# Patient Record
Sex: Male | Born: 1985 | Race: White | Hispanic: No | Marital: Single | Smoking: Never smoker
Health system: Southern US, Community
[De-identification: ages and names within clinical notes are randomized; demographics above are authoritative.]

---

## 2009-07-03 ENCOUNTER — Encounter: Admission: RE | Admit: 2009-07-03 | Discharge: 2009-07-03 | Payer: Self-pay | Admitting: Occupational Medicine

## 2010-05-17 ENCOUNTER — Emergency Department (HOSPITAL_BASED_OUTPATIENT_CLINIC_OR_DEPARTMENT_OTHER)
Admission: EM | Admit: 2010-05-17 | Discharge: 2010-05-17 | Disposition: A | Discharge status: Home / Self Care | Payer: Worker's Compensation | Attending: Emergency Medicine | Admitting: Emergency Medicine

## 2010-05-17 DIAGNOSIS — T1590XA Foreign body on external eye, part unspecified, unspecified eye, initial encounter: Secondary | ICD-10-CM | POA: Insufficient documentation

## 2010-05-17 DIAGNOSIS — Y9289 Other specified places as the place of occurrence of the external cause: Secondary | ICD-10-CM | POA: Insufficient documentation

## 2010-05-17 DIAGNOSIS — Z7721 Contact with and (suspected) exposure to potentially hazardous body fluids: Secondary | ICD-10-CM | POA: Insufficient documentation

## 2011-03-15 ENCOUNTER — Ambulatory Visit: Payer: Self-pay

## 2011-03-15 ENCOUNTER — Other Ambulatory Visit: Payer: Self-pay | Admitting: Occupational Medicine

## 2011-03-15 DIAGNOSIS — R52 Pain, unspecified: Secondary | ICD-10-CM

## 2012-05-26 ENCOUNTER — Ambulatory Visit
Admission: RE | Admit: 2012-05-26 | Discharge: 2012-05-26 | Disposition: A | Discharge status: Home / Self Care | Payer: Worker's Compensation | Source: Ambulatory Visit | Attending: Occupational Medicine | Admitting: Occupational Medicine

## 2012-05-26 ENCOUNTER — Other Ambulatory Visit: Payer: Self-pay | Admitting: Occupational Medicine

## 2012-05-26 DIAGNOSIS — R52 Pain, unspecified: Secondary | ICD-10-CM

## 2013-06-07 ENCOUNTER — Ambulatory Visit: Payer: 59

## 2013-06-25 ENCOUNTER — Emergency Department (HOSPITAL_COMMUNITY)
Admission: EM | Admit: 2013-06-25 | Discharge: 2013-06-25 | Disposition: A | Discharge status: Home / Self Care | Payer: 59 | Source: Home / Self Care

## 2013-06-25 ENCOUNTER — Encounter (HOSPITAL_COMMUNITY): Payer: Self-pay | Admitting: Emergency Medicine

## 2013-06-25 DIAGNOSIS — S01511A Laceration without foreign body of lip, initial encounter: Secondary | ICD-10-CM

## 2013-06-25 DIAGNOSIS — S0181XA Laceration without foreign body of other part of head, initial encounter: Secondary | ICD-10-CM

## 2013-06-25 DIAGNOSIS — S0180XA Unspecified open wound of other part of head, initial encounter: Secondary | ICD-10-CM
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
## 2013-06-25 NOTE — Discharge Instructions (Signed)
Facial Laceration A facial laceration is a cut on the face. These injuries can be painful and cause bleeding. Some cuts may need to be closed with stitches (sutures), skin adhesive strips, or wound glue. Cuts usually heal quickly but can leave a scar. It can take 1 2 years for the scar to go away completely. HOME CARE   Only take medicines as told by your doctor.  Follow your doctor's instructions for wound care. For Stitches:  Keep the cut clean and dry.  If you have a bandage (dressing), change it at least once a day. Change the bandage if it gets wet or dirty, or as told by your doctor.  Wash the cut with soap and water 2 times a day. Rinse the cut with water. Pat it dry with a clean towel.  Put a thin layer of medicated cream on the cut as told by your doctor.  You may shower after the first 24 hours. Do not soak the cut in water until the stitches are removed.  Have your stitches removed as told by your doctor.  Do not wear any makeup until a few days after your stitches are removed. For Skin Adhesive Strips:  Keep the cut clean and dry.  Do not get the strips wet. You may take a bath, but be careful to keep the cut dry.  If the cut gets wet, pat it dry with a clean towel.  The strips will fall off on their own. Do not remove the strips that are still stuck to the cut. For Wound Glue:  You may shower or take baths. Do not soak or scrub the cut. Do not swim. Avoid heavy sweating until the glue falls off on its own. After a shower or bath, pat the cut dry with a clean towel.  Do not put medicine or makeup on your cut until the glue falls off.  If you have a bandage, do not put tape over the glue.  Avoid lots of sunlight or tanning lamps until the glue falls off.  The glue will fall off on its own in 5 10 days. Do not pick at the glue. After Healing: Put sunscreen on the cut for the first year to reduce your scar. GET HELP RIGHT AWAY IF:   Your cut area gets red,  painful, or puffy (swollen).  You see a yellowish-white fluid (pus) coming from the cut.  You have chills or a fever. MAKE SURE YOU:   Understand these instructions.  Will watch your condition.  Will get help right away if you are not doing well or get worse. Document Released: 07/17/2007 Document Revised: 11/18/2012 Document Reviewed: 09/10/2012 [facility] Patient Information 2014 [place], [place].  Open Wound, Lip An open wound is a break in the skin caused by an injury. An open wound to the lip can be a scrape, cut, or hole (puncture). Good wound care will help:   Lessen pain.  Prevent infection.  Lessen scarring. HOME CARE  Wash off all dirt.  Clean your wounds daily with gentle soap and water.  Eat soft foods or liquids while your wound is healing.  Rinse the wound with salt water after each meal.  Apply medicated cream after the wound has been cleaned as told by your doctor.  Follow up with your doctor as told. GET HELP RIGHT AWAY IF:   There is more redness or puffiness (swelling) in or around the wound.  There is increasing pain.  You have a temperature by  mouth above 102 F (38.9 C), not controlled by medicine.  Your baby is older than 3 months with a rectal temperature of 102 F (38.9 C) or higher.  Your baby is 863 months old or younger with a rectal temperature of 100.4 F (38 C) or higher.  There is yellowish white fluid (pus) coming from the wound.  Very bad pain develops that is not controlled with medicine.  There is a red line on the skin above or below the wound. MAKE SURE YOU:   Understand these instructions.  Will watch this condition.  Will get help right away if you are not doing well or get worse. Document Released: 04/26/2008 Document Revised: 04/22/2011 Document Reviewed: 05/16/2009 [facility] Patient Information 2014 [place], [place].  Mouth Injury Cuts and scrapes inside the mouth are common from falls or bites. They tend to bleed  a lot. Most mouth injuries heal quickly.  HOME CARE  See your dentist right away if teeth are broken. Take all broken pieces with you to the dentist.  Press on the bleeding site with a germ free (sterile) gauze or piece of clean cloth. This will help stop the bleeding.  Cold drinks or ice will help keep the puffiness (swelling) down.  Gargle with warm salt water after 1 day. Put 1 teaspoon of salt into 1 cup of warm water.  Only take medicine as told by your doctor.  Eat soft foods until healing is complete.  Avoid any salty or citrus foods. They may sting your mouth.  Rinse your mouth with warm water after meals. GET HELP RIGHT AWAY IF:   You have a large amount of bleeding that will not stop.  You have severe pain.  You have trouble swallowing.  Your mouth becomes infected.  You have a fever. MAKE SURE YOU:   Understand these instructions.  Will watch your condition.  Will get help right away if you are not doing well or get worse. Document Released: 04/24/2009 Document Revised: 04/22/2011 Document Reviewed: 04/24/2009 [facility] Patient Information 2014 [place]Exit[place], [place].

## 2013-06-25 NOTE — ED Provider Notes (Signed)
Medical screening examination/treatment/procedure(s) were performed by resident physician or non-physician practitioner and as supervising physician I was immediately available for consultation/collaboration.   Corrinne Benegas DOUGLAS MD.   Farrell Broerman D Jaskarn Schweer, MD 06/25/13 1549 
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
## 2014-05-29 ENCOUNTER — Emergency Department (HOSPITAL_COMMUNITY)
Admission: EM | Admit: 2014-05-29 | Discharge: 2014-05-29 | Disposition: A | Discharge status: Home / Self Care | Payer: 59 | Attending: Emergency Medicine | Admitting: Emergency Medicine

## 2014-05-29 ENCOUNTER — Encounter (HOSPITAL_COMMUNITY): Payer: Self-pay | Admitting: Emergency Medicine

## 2014-05-29 ENCOUNTER — Emergency Department (HOSPITAL_COMMUNITY): Payer: 59

## 2014-05-29 DIAGNOSIS — Z79899 Other long term (current) drug therapy: Secondary | ICD-10-CM | POA: Diagnosis not present

## 2014-05-29 DIAGNOSIS — Y9365 Activity, lacrosse and field hockey: Secondary | ICD-10-CM | POA: Insufficient documentation

## 2014-05-29 DIAGNOSIS — S8001XA Contusion of right knee, initial encounter: Secondary | ICD-10-CM | POA: Insufficient documentation

## 2014-05-29 DIAGNOSIS — W21211A Struck by field hockey stick, initial encounter: Secondary | ICD-10-CM | POA: Insufficient documentation

## 2014-05-29 DIAGNOSIS — Y92328 Other athletic field as the place of occurrence of the external cause: Secondary | ICD-10-CM | POA: Diagnosis not present

## 2014-05-29 DIAGNOSIS — Y998 Other external cause status: Secondary | ICD-10-CM | POA: Diagnosis not present

## 2014-05-29 DIAGNOSIS — S8991XA Unspecified injury of right lower leg, initial encounter: Secondary | ICD-10-CM | POA: Diagnosis present

## 2014-05-29 MED ORDER — NAPROXEN 500 MG PO TABS: 500.0000 mg | ORAL_TABLET | Freq: Two times a day (BID) | ORAL | Status: DC

## 2014-05-29 MED ORDER — OXYCODONE-ACETAMINOPHEN 5-325 MG PO TABS
2.0000 | ORAL_TABLET | Freq: Once | ORAL | Status: AC
  Administered 2014-05-29: 2 via ORAL
  Filled 2014-05-29: qty 2

## 2014-05-29 MED ORDER — OXYCODONE-ACETAMINOPHEN 5-325 MG PO TABS: 1.0000 | ORAL_TABLET | Freq: Four times a day (QID) | ORAL | Status: DC | PRN

## 2014-05-29 NOTE — Discharge Instructions (Signed)
Recommend that you wear a knee immobilizer to prevent instability. Use crutches when walking to prevent from putting weight on your right leg. Remove your knee immobilizer 3-4 times per day for stretches to prevent development of a blood clot. Take naproxen as prescribed and Percocet as needed for severe pain. Ice your knee 3-4 times per day for 15-20 minutes each time. Follow-up with [facility] orthopedics for further evaluation of your injury. It is possible that you may have an injury to your patellar ligament. You may need an MRI for further evaluation of your pain. Should you wish to have an MRI completed as an outpatient, call [phone]. Return to the ED as needed if symptoms worsen.  Knee Pain The knee is the complex joint between your thigh and your lower leg. It is made up of bones, tendons, ligaments, and cartilage. The bones that make up the knee are:  The femur in the thigh.  The tibia and fibula in the lower leg.  The patella or kneecap riding in the groove on the lower femur. CAUSES  Knee pain is a common complaint with many causes. A few of these causes are:  Injury, such as:  A ruptured ligament or tendon injury.  Torn cartilage.  Medical conditions, such as:  Gout  Arthritis  Infections  Overuse, over training, or overdoing a physical activity. Knee pain can be minor or severe. Knee pain can accompany debilitating injury. Minor knee problems often respond well to self-care measures or get well on their own. More serious injuries may need medical intervention or even surgery. SYMPTOMS The knee is complex. Symptoms of knee problems can vary widely. Some of the problems are:  Pain with movement and weight bearing.  Swelling and tenderness.  Buckling of the knee.  Inability to straighten or extend your knee.  Your knee locks and you cannot straighten it.  Warmth and redness with pain and fever.  Deformity or dislocation of the kneecap. DIAGNOSIS    Determining what is wrong may be very straight forward such as when there is an injury. It can also be challenging because of the complexity of the knee. Tests to make a diagnosis may include:  Your caregiver taking a history and doing a physical exam.  Routine X-rays can be used to rule out other problems. X-rays will not reveal a cartilage tear. Some injuries of the knee can be diagnosed by:  Arthroscopy a surgical technique by which a small video camera is inserted through tiny incisions on the sides of the knee. This procedure is used to examine and repair internal knee joint problems. Tiny instruments can be used during arthroscopy to repair the torn knee cartilage (meniscus).  Arthrography is a radiology technique. A contrast liquid is directly injected into the knee joint. Internal structures of the knee joint then become visible on X-ray film.  An MRI scan is a non X-ray radiology procedure in which magnetic fields and a computer produce two- or three-dimensional images of the inside of the knee. Cartilage tears are often visible using an MRI scanner. MRI scans have largely replaced arthrography in diagnosing cartilage tears of the knee.  Blood work.  Examination of the fluid that helps to lubricate the knee joint (synovial fluid). This is done by taking a sample out using a needle and a syringe. TREATMENT The treatment of knee problems depends on the cause. Some of these treatments are:  Depending on the injury, proper casting, splinting, surgery, or physical therapy care will be needed.  Give yourself adequate recovery time. Do not overuse your joints. If you begin to get sore during workout routines, back off. Slow down or do fewer repetitions.  For repetitive activities such as cycling or running, maintain your strength and nutrition.  Alternate muscle groups. For example, if you are a weight lifter, work the upper body on one day and the lower body the next.  Either tight or  weak muscles do not give the proper support for your knee. Tight or weak muscles do not absorb the stress placed on the knee joint. Keep the muscles surrounding the knee strong.  Take care of mechanical problems.  If you have flat feet, orthotics or special shoes may help. See your caregiver if you need help.  Arch supports, sometimes with wedges on the inner or outer aspect of the heel, can help. These can shift pressure away from the side of the knee most bothered by osteoarthritis.  A brace called an "unloader" brace also may be used to help ease the pressure on the most arthritic side of the knee.  If your caregiver has prescribed crutches, braces, wraps or ice, use as directed. The acronym for this is PRICE. This means protection, rest, ice, compression, and elevation.  Nonsteroidal anti-inflammatory drugs (NSAIDs), can help relieve pain. But if taken immediately after an injury, they may actually increase swelling. Take NSAIDs with food in your stomach. Stop them if you develop stomach problems. Do not take these if you have a history of ulcers, stomach pain, or bleeding from the bowel. Do not take without your caregiver's approval if you have problems with fluid retention, heart failure, or kidney problems.  For ongoing knee problems, physical therapy may be helpful.  Glucosamine and chondroitin are over-the-counter dietary supplements. Both may help relieve the pain of osteoarthritis in the knee. These medicines are different from the usual anti-inflammatory drugs. Glucosamine may decrease the rate of cartilage destruction.  Injections of a corticosteroid drug into your knee joint may help reduce the symptoms of an arthritis flare-up. They may provide pain relief that lasts a few months. You may have to wait a few months between injections. The injections do have a small increased risk of infection, water retention, and elevated blood sugar levels.  Hyaluronic acid injected into damaged  joints may ease pain and provide lubrication. These injections may work by reducing inflammation. A series of shots may give relief for as long as 6 months.  Topical painkillers. Applying certain ointments to your skin may help relieve the pain and stiffness of osteoarthritis. Ask your pharmacist for suggestions. Many over the-counter products are approved for temporary relief of arthritis pain.  In some countries, doctors often prescribe topical NSAIDs for relief of chronic conditions such as arthritis and tendinitis. A review of treatment with NSAID creams found that they worked as well as oral medications but without the serious side effects. PREVENTION  Maintain a healthy weight. Extra pounds put more strain on your joints.  Get strong, stay limber. Weak muscles are a common cause of knee injuries. Stretching is important. Include flexibility exercises in your workouts.  Be smart about exercise. If you have osteoarthritis, chronic knee pain or recurring injuries, you may need to change the way you exercise. This does not mean you have to stop being active. If your knees ache after jogging or playing basketball, consider switching to swimming, water aerobics, or other low-impact activities, at least for a few days a week. Sometimes limiting high-impact activities will provide  relief.  Make sure your shoes fit well. Choose footwear that is right for your sport.  Protect your knees. Use the proper gear for knee-sensitive activities. Use kneepads when playing volleyball or laying carpet. Buckle your seat belt every time you drive. Most shattered kneecaps occur in car accidents.  Rest when you are tired. SEEK MEDICAL CARE IF:  You have knee pain that is continual and does not seem to be getting better.  SEEK IMMEDIATE MEDICAL CARE IF:  Your knee joint feels hot to the touch and you have a high fever. MAKE SURE YOU:   Understand these instructions.  Will watch your condition.  Will get help  right away if you are not doing well or get worse. Document Released: 11/25/2006 Document Revised: 04/22/2011 Document Reviewed: 11/25/2006 [facility] Patient Information 2015 [place], [place]. This information is not intended to replace advice given to you by your health care provider. Make sure you discuss any questions you have with your health care provider.  RICE: Routine Care for Injuries The routine care of many injuries includes Rest, Ice, Compression, and Elevation (RICE). HOME CARE INSTRUCTIONS  Rest is needed to allow your body to heal. Routine activities can usually be resumed when comfortable. Injured tendons and bones can take up to 6 weeks to heal. Tendons are the cord-like structures that attach muscle to bone.  Ice following an injury helps keep the swelling down and reduces pain.  Put ice in a plastic bag.  Place a towel between your skin and the bag.  Leave the ice on for 15-20 minutes, 3-4 times a day, or as directed by your health care provider. Do this while awake, for the first 24 to 48 hours. After that, continue as directed by your caregiver.  Compression helps keep swelling down. It also gives support and helps with discomfort. If an elastic bandage has been applied, it should be removed and reapplied every 3 to 4 hours. It should not be applied tightly, but firmly enough to keep swelling down. Watch fingers or toes for swelling, bluish discoloration, coldness, numbness, or excessive pain. If any of these problems occur, remove the bandage and reapply loosely. Contact your caregiver if these problems continue.  Elevation helps reduce swelling and decreases pain. With extremities, such as the arms, hands, legs, and feet, the injured area should be placed near or above the level of the heart, if possible. SEEK IMMEDIATE MEDICAL CARE IF:  You have persistent pain and swelling.  You develop redness, numbness, or unexpected weakness.  Your symptoms are getting worse  rather than improving after several days. These symptoms may indicate that further evaluation or further X-rays are needed. Sometimes, X-rays may not show a small broken bone (fracture) until 1 week or 10 days later. Make a follow-up appointment with your caregiver. Ask when your X-ray results will be ready. Make sure you get your X-ray results. Document Released: 05/12/2000 Document Revised: 02/02/2013 Document Reviewed: 06/29/2010 [facility] Patient Information 2015 [place], [place]. This information is not intended to replace advice given to you by your health care provider. Make sure you discuss any questions you have with your health care provider.

## 2014-05-29 NOTE — ED Provider Notes (Signed)
CSN: [id number]     Arrival date & time 05/29/14  0024 History   First MD Initiated Contact with Patient 05/29/14 0043     Chief Complaint  Patient presents with  . Knee Injury     (Consider location/radiation/quality/duration/timing/severity/associated sxs/prior Treatment) Patient is a 29 y.o. male presenting with foot injury. The history is provided by the patient. No language interpreter was used.  Foot Injury Location:  Knee Time since incident: 1 hour PTA. Injury: yes   Mechanism of injury comment:  Patient ran into the side of a hockey rink during the game; knee collided with side of rink Knee location:  R knee Pain details:    Quality:  Sharp, throbbing and aching   Radiates to:  Does not radiate   Severity:  Severe   Onset quality:  Sudden   Timing:  Constant   Progression:  Worsening Chronicity:  New Dislocation: no   Prior injury to area:  No Relieved by:  Nothing Worsened by:  Bearing weight and extension Ineffective treatments:  Rest Associated symptoms: decreased ROM and swelling   Associated symptoms: no numbness and no tingling     History reviewed. No pertinent past medical history. History reviewed. No pertinent past surgical history. No family history on file. History  Substance Use Topics  . Smoking status: Never Smoker   . Smokeless tobacco: Not on file  . Alcohol Use: Yes    Review of Systems  Musculoskeletal: Positive for joint swelling and arthralgias.  All other systems reviewed and are negative.   Allergies  Review of patient's allergies indicates no known allergies.  Home Medications   Prior to Admission medications   Medication Sig Start Date End Date Taking? Authorizing Provider  Coenzyme Q10 (COQ10 PO) Take 1 capsule by mouth daily.   Yes Historical Provider, MD  LEVOTHYROXINE SODIUM PO Take 1 tablet by mouth daily.   Yes Historical Provider, MD  Red Yeast Rice Extract (RED YEAST RICE PO) Take 2 capsules by mouth 2 (two) times  daily.   Yes Historical Provider, MD  naproxen (NAPROSYN) 500 MG tablet Take 1 tablet (500 mg total) by mouth 2 (two) times daily. 05/29/14   Antony Madura, PA-C  oxyCODONE-acetaminophen (PERCOCET/ROXICET) 5-325 MG per tablet Take 1-2 tablets by mouth every 6 (six) hours as needed for severe pain. 05/29/14   Antony Madura, PA-C   BP 127/60 mmHg  Pulse 95  Temp(Src) 98.1 F (36.7 C) (Oral)  Resp 14  Ht  (1.778 m)  Wt 245 lb (111.131 kg)  BMI 35.15 kg/m2  SpO2 95%   Physical Exam  Constitutional: He is oriented to person, place, and time. He appears well-developed and well-nourished. No distress.  Nontoxic/nonseptic appearing  HENT:  Head: Normocephalic and atraumatic.  Eyes: Conjunctivae and EOM are normal. No scleral icterus.  Neck: Normal range of motion.  Cardiovascular: Normal rate, regular rhythm and intact distal pulses.   DP and PT pulses 2+ b/l  Pulmonary/Chest: Effort normal. No respiratory distress.  Respirations even and unlabored  Musculoskeletal:       Right knee: He exhibits decreased range of motion, swelling, ecchymosis and bony tenderness. He exhibits no deformity, no erythema, no LCL laxity and no MCL laxity. Tenderness found. Patellar tendon tenderness noted.       Legs: Decreased range of motion of right knee secondary to pain. Patient was swelling and chemosis inferior to his patella with tenderness along the course of his patellar tendon. Mild bony tenderness. No crepitus or  deformity.  Neurological: He is alert and oriented to person, place, and time. He exhibits normal muscle tone. Coordination normal.  Sensation to light touch intact. Patient able to wiggle all toes. Achilles tendon reflex intact.  Skin: Skin is warm and dry. No rash noted. He is not diaphoretic. No erythema. No pallor.  Psychiatric: He has a normal mood and affect. His behavior is normal.  Nursing note and vitals reviewed.   ED Course  Procedures (including critical care time) Labs  Review Labs Reviewed - No data to display  Imaging Review Dg Knee Complete 4 Views Right  05/29/2014   CLINICAL DATA:  Right knee injury playing hockey. Redness and swelling.  EXAM: RIGHT KNEE - COMPLETE 4+ VIEW  COMPARISON:  None.  FINDINGS: Infrapatellar soft tissue swelling with indistinct patellar tendon laterally. There is no patella Alta and the patellar tendon is partly visible on the oblique image. No fracture or dislocation.  IMPRESSION: 1. Infrapatellar soft tissue swelling with indistinct patellar tendon. Correlate with extensor exam. 2. No fracture.   Electronically Signed   By: Marnee SpringJonathon  Watts M.D.   On: 05/29/2014 01:00     EKG Interpretation None      MDM   Final diagnoses:  Knee contusion, right, initial encounter    29 year old male presents to the emergency department for further evaluation of right knee pain. Patient had his knee collided with the side of a hockey rink while playing hockey earlier this evening. No head trauma or loss of consciousness. Patient is neurovascularly intact. He has good range of motion of his right knee passively, but decreased active range of motion. Pain is mostly present with significant flexion or hyperextension. High suspicion for patellar tendon injury, though doubt patellar tendon rupture as patella is not high riding. Patient placed in knee immobilizer and given crutches. Will manage as outpatient with NSAIDs and pain medication. Ice advised as well as orthopedic follow-up. Referral given to Dr. Ranell PatrickNorris as patient [facility]. Return precautions discussed and provided. Patient agreeable to plan with no unaddressed concerns. Patient discharged in good condition.   Filed Vitals:   05/29/14 0029 05/29/14 0147  BP: 125/84 127/60  Pulse: 103 95  Temp: 98.1 F (36.7 C)   TempSrc: Oral   Resp: 18 14  Height: 5\' 10"  (1.778 m)   Weight: 245 lb (111.131 kg)   SpO2: 100% 95%     Antony MaduraKelly Aletta Edmunds,  PA-C 05/29/14 0405  Paula LibraJohn Molpus, MD 05/29/14 [phone]

## 2014-05-29 NOTE — ED Notes (Signed)
Patient presents for right knee injury playing hockey earlier tonight. Redness and swelling to same. Positive PMS. 10/10 pain.

## 2015-06-18 ENCOUNTER — Emergency Department (HOSPITAL_COMMUNITY): Payer: 59

## 2015-06-18 ENCOUNTER — Emergency Department (HOSPITAL_COMMUNITY)
Admission: EM | Admit: 2015-06-18 | Discharge: 2015-06-19 | Disposition: A | Discharge status: Home / Self Care | Payer: 59 | Attending: Emergency Medicine | Admitting: Emergency Medicine

## 2015-06-18 ENCOUNTER — Encounter (HOSPITAL_COMMUNITY): Payer: Self-pay | Admitting: Emergency Medicine

## 2015-06-18 DIAGNOSIS — Z79899 Other long term (current) drug therapy: Secondary | ICD-10-CM | POA: Diagnosis not present

## 2015-06-18 DIAGNOSIS — Z791 Long term (current) use of non-steroidal anti-inflammatories (NSAID): Secondary | ICD-10-CM | POA: Insufficient documentation

## 2015-06-18 DIAGNOSIS — Y998 Other external cause status: Secondary | ICD-10-CM | POA: Diagnosis not present

## 2015-06-18 DIAGNOSIS — Y9289 Other specified places as the place of occurrence of the external cause: Secondary | ICD-10-CM | POA: Diagnosis not present

## 2015-06-18 DIAGNOSIS — S199XXA Unspecified injury of neck, initial encounter: Secondary | ICD-10-CM | POA: Diagnosis not present

## 2015-06-18 DIAGNOSIS — Y9322 Activity, ice hockey: Secondary | ICD-10-CM | POA: Insufficient documentation

## 2015-06-18 DIAGNOSIS — S4991XA Unspecified injury of right shoulder and upper arm, initial encounter: Secondary | ICD-10-CM | POA: Diagnosis not present

## 2015-06-18 DIAGNOSIS — M25511 Pain in right shoulder: Secondary | ICD-10-CM

## 2015-06-18 DIAGNOSIS — Z7951 Long term (current) use of inhaled steroids: Secondary | ICD-10-CM | POA: Insufficient documentation

## 2015-06-18 DIAGNOSIS — M25521 Pain in right elbow: Secondary | ICD-10-CM

## 2015-06-18 DIAGNOSIS — S59901A Unspecified injury of right elbow, initial encounter: Secondary | ICD-10-CM | POA: Diagnosis not present

## 2015-06-18 DIAGNOSIS — M542 Cervicalgia: Secondary | ICD-10-CM

## 2015-06-18 MED ORDER — ONDANSETRON 4 MG PO TBDP
4.0000 mg | ORAL_TABLET | Freq: Once | ORAL | Status: AC
  Administered 2015-06-18: 4 mg via ORAL
  Filled 2015-06-18: qty 1

## 2015-06-18 MED ORDER — HYDROMORPHONE HCL 1 MG/ML IJ SOLN
1.0000 mg | Freq: Once | INTRAMUSCULAR | Status: AC
Start: 2015-06-18 — End: 2015-06-18
  Administered 2015-06-18: 1 mg via INTRAVENOUS

## 2015-06-18 MED ORDER — MELOXICAM 15 MG PO TABS: 15.0000 mg | ORAL_TABLET | Freq: Every day | ORAL | Status: DC

## 2015-06-18 MED ORDER — HYDROMORPHONE HCL 1 MG/ML IJ SOLN
1.0000 mg | Freq: Once | INTRAMUSCULAR | Status: AC
  Administered 2015-06-18: 1 mg via INTRAVENOUS
  Filled 2015-06-18: qty 1

## 2015-06-18 MED ORDER — KETOROLAC TROMETHAMINE 30 MG/ML IJ SOLN
30.0000 mg | Freq: Once | INTRAMUSCULAR | Status: AC
  Administered 2015-06-18: 30 mg via INTRAVENOUS
  Filled 2015-06-18: qty 1

## 2015-06-18 MED ORDER — HYDROMORPHONE HCL 1 MG/ML IJ SOLN
INTRAMUSCULAR | Status: AC
  Administered 2015-06-18: 1 mg via INTRAVENOUS
  Filled 2015-06-18: qty 1

## 2015-06-18 NOTE — ED Notes (Signed)
Pt and family reports understanding of discharge information. No questions at time of discharge 
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
## 2015-06-18 NOTE — ED Notes (Signed)
Bed: [id number] Expected date:  Expected time:  Means of arrival:  Comments: EMS R arm injury
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
## 2015-06-18 NOTE — Discharge Instructions (Signed)
Follow-up with your primary care provider and [facility] tomorrow regarding your visit to the emergency department today.  Return to the emergency department if you experience blurred vision, dizziness, nausea, vomiting, numbness, weakness.  Joint Pain Joint pain, which is also called arthralgia, can be caused by many things. Joint pain often goes away when you follow your health care provider's instructions for relieving pain at home. However, joint pain can also be caused by conditions that require further treatment. Common causes of joint pain include:  Bruising in the area of the joint.  Overuse of the joint.  Wear and tear on the joints that occur with aging (osteoarthritis).  Various other forms of arthritis.  A buildup of a crystal form of uric acid in the joint (gout).  Infections of the joint (septic arthritis) or of the bone (osteomyelitis). Your health care provider may recommend medicine to help with the pain. If your joint pain continues, additional tests may be needed to diagnose your condition. HOME CARE INSTRUCTIONS Watch your condition for any changes. Follow these instructions as directed to lessen the pain that you are feeling.  Take medicines only as directed by your health care provider.  Rest the affected area for as long as your health care provider says that you should. If directed to do so, raise the painful joint above the level of your heart while you are sitting or lying down.  Do not do things that cause or worsen pain.  If directed, apply ice to the painful area:  Put ice in a plastic bag.  Place a towel between your skin and the bag.  Leave the ice on for 20 minutes, 2-3 times per day.  Wear an elastic bandage, splint, or sling as directed by your health care provider. Loosen the elastic bandage or splint if your fingers or toes become numb and tingle, or if they turn cold and blue.  Begin exercising or stretching the  affected area as directed by your health care provider. Ask your health care provider what types of exercise are safe for you.  Keep all follow-up visits as directed by your health care provider. This is important. SEEK MEDICAL CARE IF:  Your pain increases, and medicine does not help.  Your joint pain does not improve within 3 days.  You have increased bruising or swelling.  You have a fever.  You lose 10 lb (4.5 kg) or more without trying. SEEK IMMEDIATE MEDICAL CARE IF:  You are not able to move the joint.  Your fingers or toes become numb or they turn cold and blue.   This information is not intended to replace advice given to you by your health care provider. Make sure you discuss any questions you have with your health care provider.   Document Released: 01/28/2005 Document Revised: 02/18/2014 Document Reviewed: 11/09/2013 Elsevier Interactive Patient Education [employer]

## 2015-06-18 NOTE — ED Notes (Signed)
C-collar removed by provider

## 2016-11-06 ENCOUNTER — Ambulatory Visit
Admission: RE | Admit: 2016-11-06 | Discharge: 2016-11-06 | Disposition: A | Discharge status: Home / Self Care | Payer: No Typology Code available for payment source | Source: Ambulatory Visit | Attending: Occupational Medicine | Admitting: Occupational Medicine

## 2016-11-06 ENCOUNTER — Other Ambulatory Visit: Payer: Self-pay | Admitting: Occupational Medicine

## 2016-11-06 DIAGNOSIS — Z0000 Encounter for general adult medical examination without abnormal findings: Secondary | ICD-10-CM

## 2018-08-10 IMAGING — CR DG CHEST 2V
2 series · 2 of 2 positions shown · non-contrast
Comparison: Chest x-ray of November 22, 2015

CLINICAL DATA: Exit physical examination from the fire department.
No chest complaints. Nonsmoker.

EXAM:
CHEST  2 VIEW

[w chest pa]
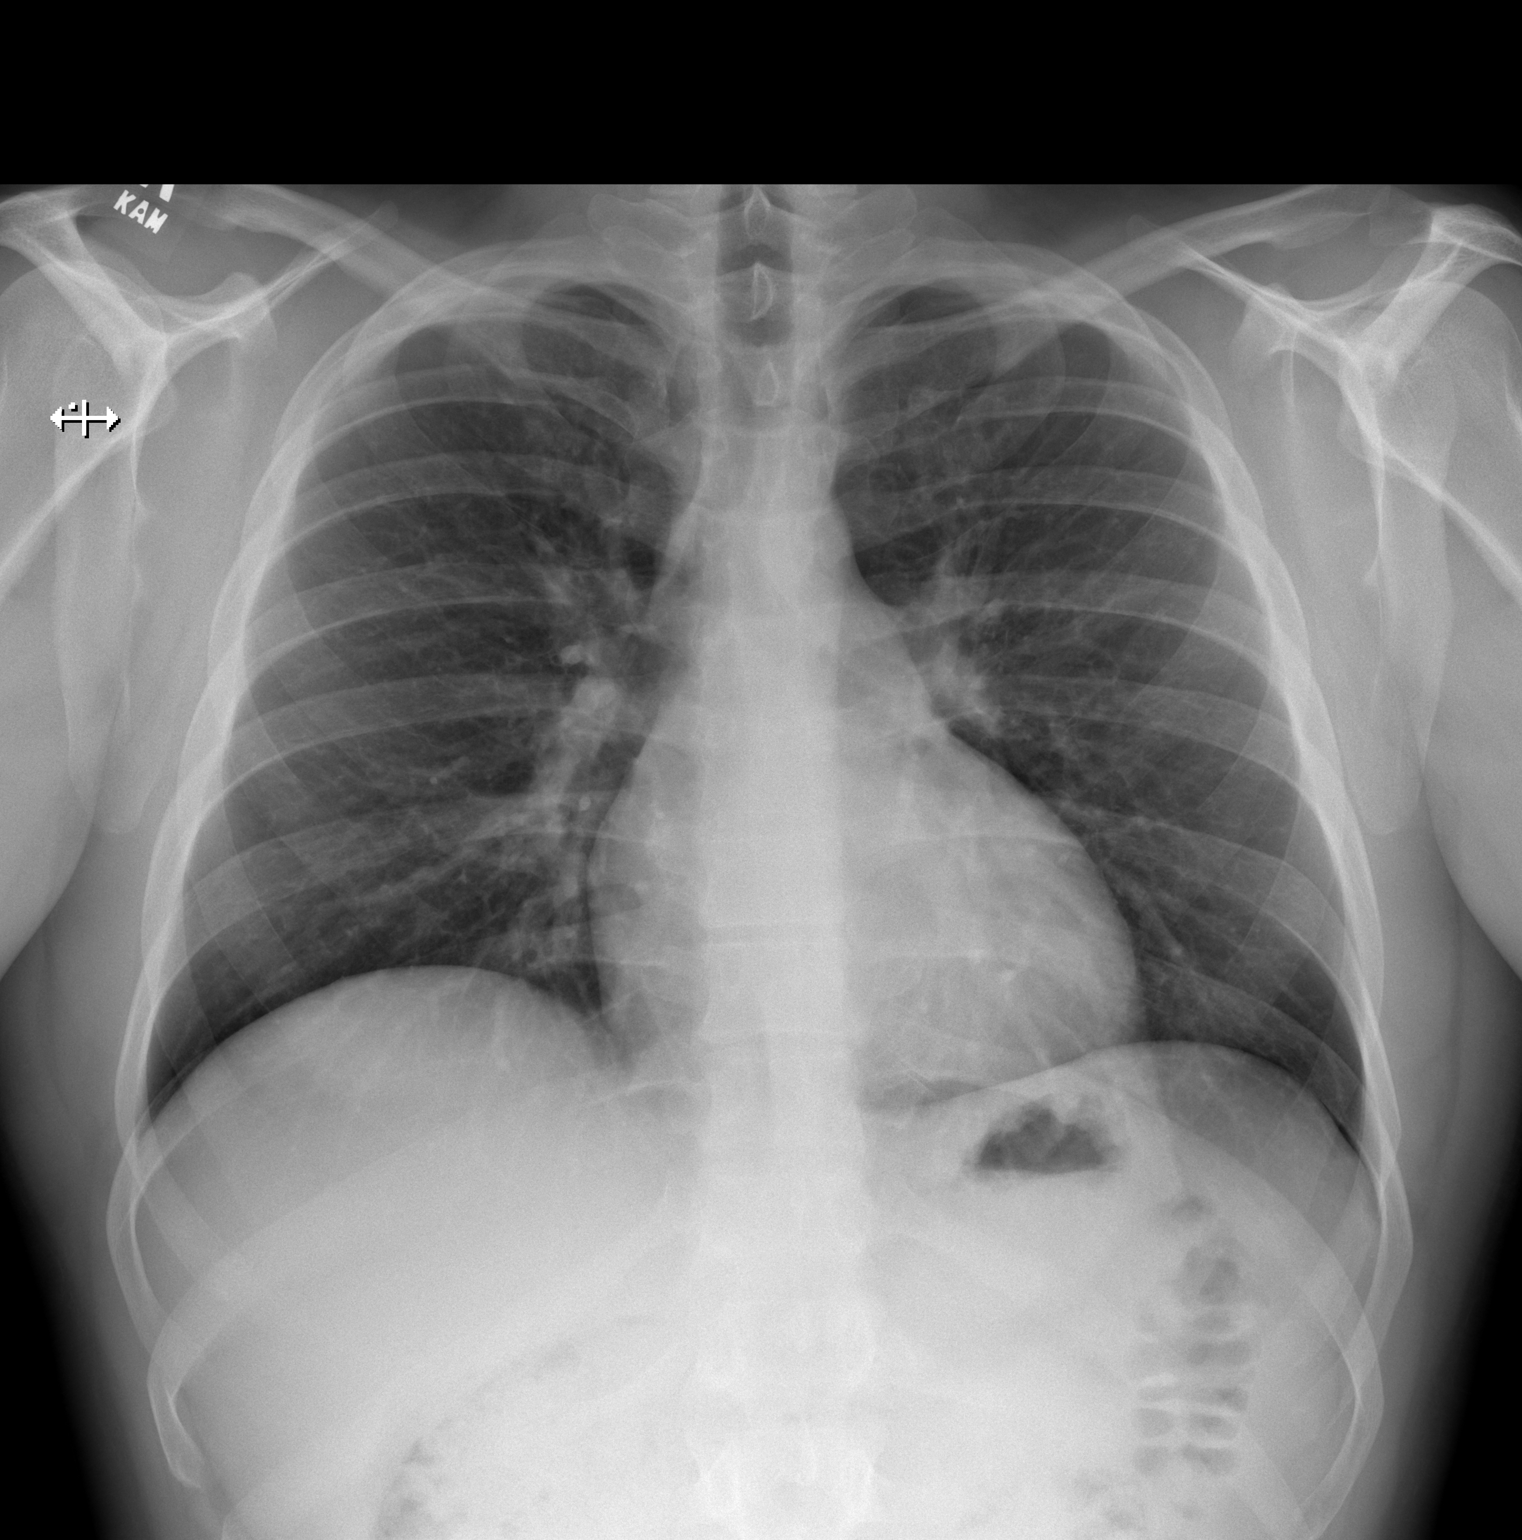

[w chest decub]
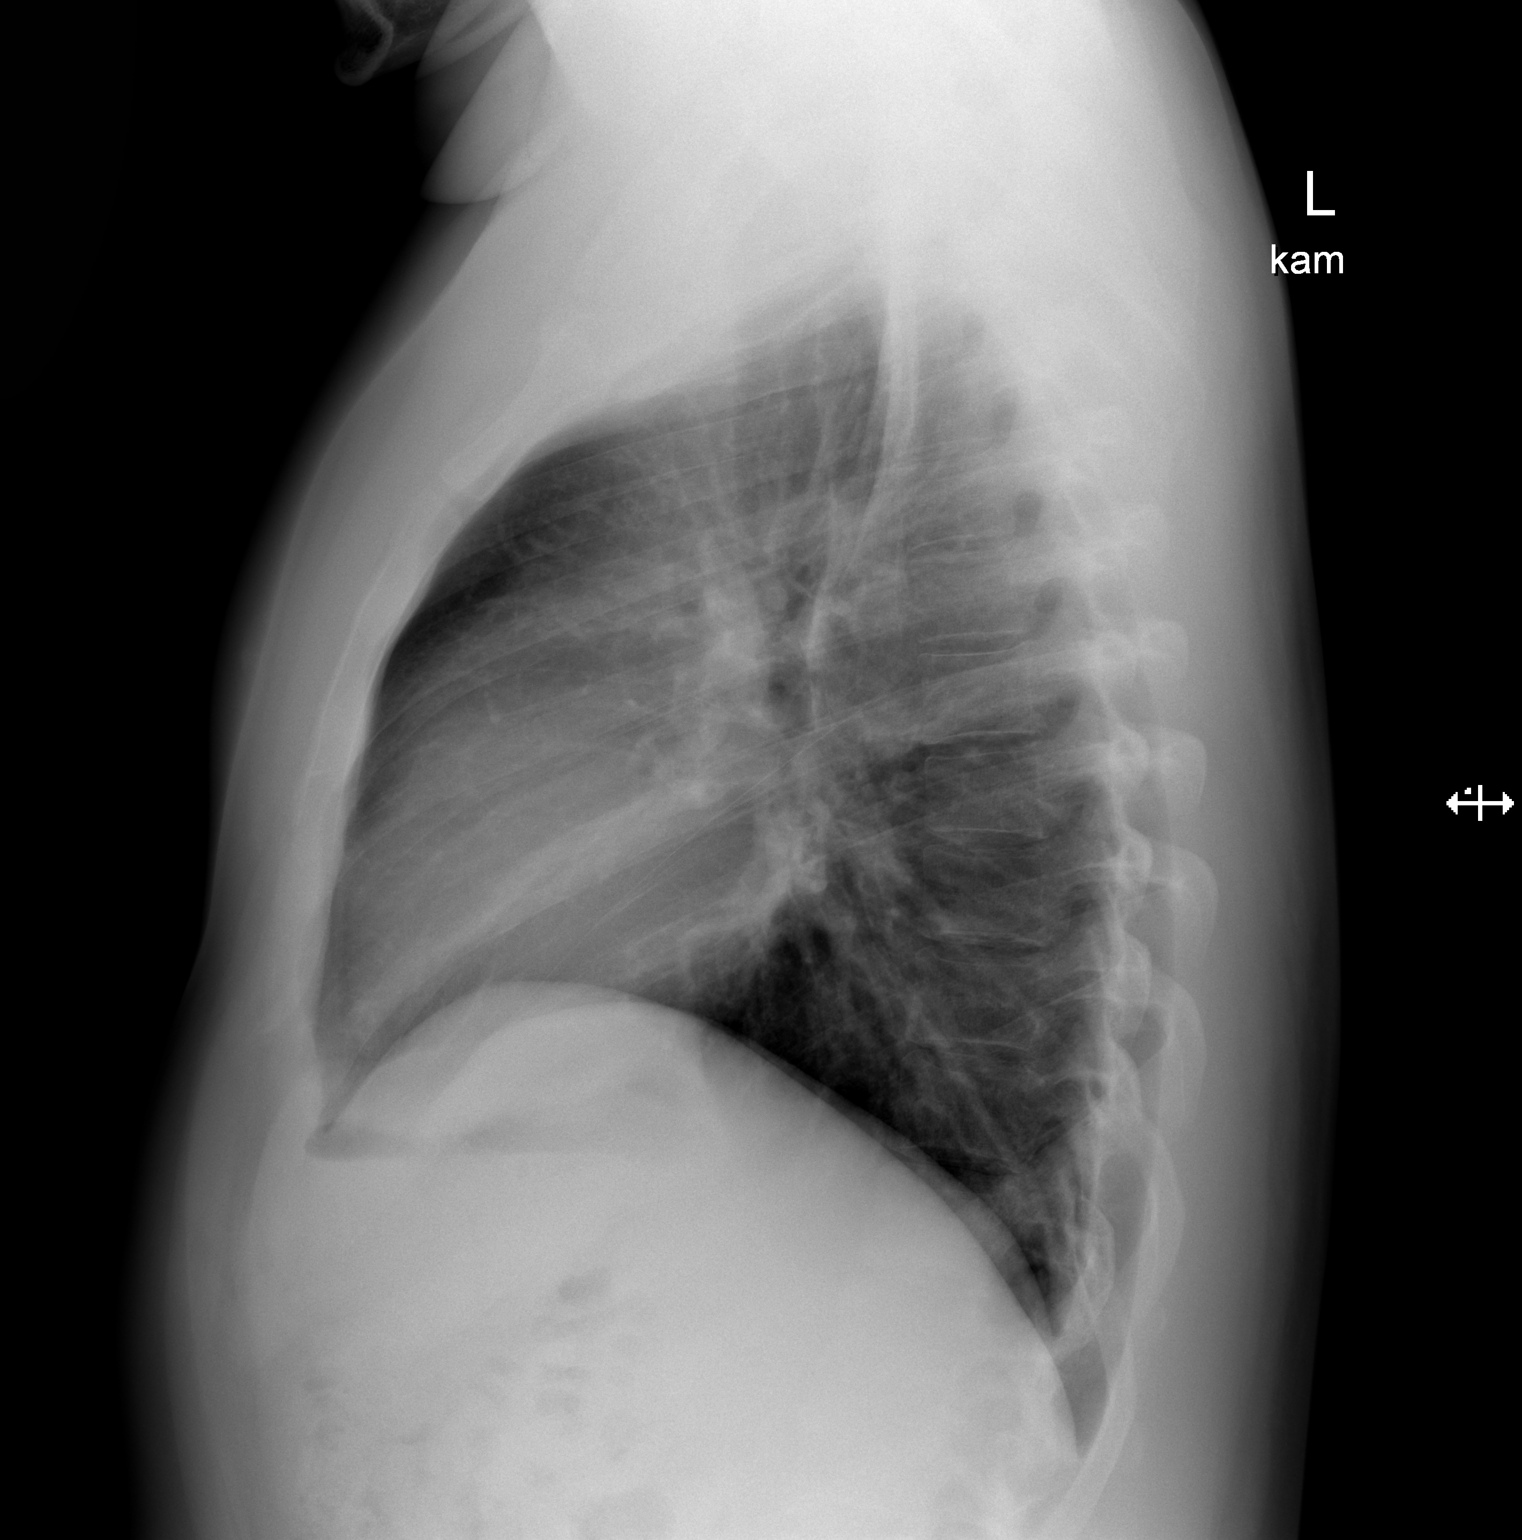

[2 of 2 positions shown; findings below may reference images not displayed]

FINDINGS: The lungs are adequately inflated and clear. The heart and pulmonary
vascularity are normal. The mediastinum is normal in width. The bony
thorax is unremarkable.
IMPRESSION: There is no active cardiopulmonary disease.

## 2019-12-08 ENCOUNTER — Other Ambulatory Visit: Payer: Self-pay

## 2019-12-08 DIAGNOSIS — Z20822 Contact with and (suspected) exposure to covid-19: Secondary | ICD-10-CM

## 2019-12-10 LAB — SARS-COV-2, NAA 2 DAY TAT

## 2019-12-10 LAB — NOVEL CORONAVIRUS, NAA: SARS-CoV-2, NAA: NOT DETECTED

## 2020-08-03 ENCOUNTER — Other Ambulatory Visit: Payer: Self-pay

## 2020-08-03 ENCOUNTER — Emergency Department: Payer: Worker's Compensation

## 2020-08-03 ENCOUNTER — Emergency Department
Admission: EM | Admit: 2020-08-03 | Discharge: 2020-08-03 | Disposition: A | Discharge status: Home / Self Care | Payer: Worker's Compensation | Attending: Emergency Medicine | Admitting: Emergency Medicine

## 2020-08-03 ENCOUNTER — Encounter: Payer: Self-pay | Admitting: Emergency Medicine

## 2020-08-03 DIAGNOSIS — Y990 Civilian activity done for income or pay: Secondary | ICD-10-CM | POA: Diagnosis not present

## 2020-08-03 DIAGNOSIS — Y9289 Other specified places as the place of occurrence of the external cause: Secondary | ICD-10-CM | POA: Diagnosis not present

## 2020-08-03 DIAGNOSIS — W228XXA Striking against or struck by other objects, initial encounter: Secondary | ICD-10-CM | POA: Insufficient documentation

## 2020-08-03 DIAGNOSIS — S0990XA Unspecified injury of head, initial encounter: Secondary | ICD-10-CM | POA: Diagnosis present

## 2020-08-03 DIAGNOSIS — Y9389 Activity, other specified: Secondary | ICD-10-CM | POA: Diagnosis not present

## 2020-08-03 DIAGNOSIS — S0083XA Contusion of other part of head, initial encounter: Secondary | ICD-10-CM | POA: Insufficient documentation

## 2020-08-03 MED ORDER — NAPROXEN 500 MG PO TABS: 500.0000 mg | ORAL_TABLET | Freq: Two times a day (BID) | ORAL | Status: DC

## 2020-08-03 MED ORDER — NAPROXEN 500 MG PO TABS
500.0000 mg | ORAL_TABLET | Freq: Once | ORAL | Status: AC
  Administered 2020-08-03: 500 mg via ORAL
  Filled 2020-08-03: qty 1
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
## 2020-08-03 NOTE — Discharge Instructions (Addendum)
Maxillofacial CT was negative for dislocation or fracture.  Read and follow discharge care instruction.  Take medication as directed.

## 2023-03-18 ENCOUNTER — Emergency Department (HOSPITAL_COMMUNITY): Payer: Worker's Compensation

## 2023-03-18 ENCOUNTER — Other Ambulatory Visit: Payer: Self-pay

## 2023-03-18 ENCOUNTER — Emergency Department (HOSPITAL_COMMUNITY)
Admission: EM | Admit: 2023-03-18 | Discharge: 2023-03-18 | Disposition: A | Discharge status: Home / Self Care | Payer: Worker's Compensation | Attending: Emergency Medicine | Admitting: Emergency Medicine

## 2023-03-18 DIAGNOSIS — Y92410 Unspecified street and highway as the place of occurrence of the external cause: Secondary | ICD-10-CM | POA: Diagnosis not present

## 2023-03-18 DIAGNOSIS — S0181XA Laceration without foreign body of other part of head, initial encounter: Secondary | ICD-10-CM | POA: Diagnosis not present

## 2023-03-18 DIAGNOSIS — R1030 Lower abdominal pain, unspecified: Secondary | ICD-10-CM | POA: Insufficient documentation

## 2023-03-18 DIAGNOSIS — D72829 Elevated white blood cell count, unspecified: Secondary | ICD-10-CM | POA: Insufficient documentation

## 2023-03-18 DIAGNOSIS — S51811A Laceration without foreign body of right forearm, initial encounter: Secondary | ICD-10-CM | POA: Insufficient documentation

## 2023-03-18 DIAGNOSIS — Y990 Civilian activity done for income or pay: Secondary | ICD-10-CM | POA: Diagnosis not present

## 2023-03-18 DIAGNOSIS — M542 Cervicalgia: Secondary | ICD-10-CM | POA: Insufficient documentation

## 2023-03-18 DIAGNOSIS — S59911A Unspecified injury of right forearm, initial encounter: Secondary | ICD-10-CM | POA: Diagnosis present

## 2023-03-18 LAB — COMPREHENSIVE METABOLIC PANEL
ALT: 31 U/L (ref 0–44)
AST: 46 U/L — ABNORMAL HIGH (ref 15–41)
Albumin: 3.4 g/dL — ABNORMAL LOW (ref 3.5–5.0)
Alkaline Phosphatase: 56 U/L (ref 38–126)
Anion gap: 8 (ref 5–15)
BUN: 9 mg/dL (ref 6–20)
CO2: 25 mmol/L (ref 22–32)
Calcium: 9.2 mg/dL (ref 8.9–10.3)
Chloride: 105 mmol/L (ref 98–111)
Creatinine, Ser: 1.11 mg/dL (ref 0.61–1.24)
GFR, Estimated: >60 mL/min (ref >60)
Glucose, Bld: 120 mg/dL — ABNORMAL HIGH (ref 70–99)
Potassium: 4.1 mmol/L (ref 3.5–5.1)
Sodium: 138 mmol/L (ref 135–145)
Total Bilirubin: 1.1 mg/dL (ref 0.0–1.2)
Total Protein: 6.5 g/dL (ref 6.5–8.1)

## 2023-03-18 LAB — I-STAT CHEM 8, ED
BUN: 14 mg/dL (ref 6–20)
Calcium, Ion: 1.15 mmol/L (ref 1.15–1.40)
Chloride: 102 mmol/L (ref 98–111)
Creatinine, Ser: 1.1 mg/dL (ref 0.61–1.24)
Glucose, Bld: 116 mg/dL — ABNORMAL HIGH (ref 70–99)
HCT: 42 % (ref 39.0–52.0)
Hemoglobin: 14.3 g/dL (ref 13.0–17.0)
Potassium: 4.1 mmol/L (ref 3.5–5.1)
Sodium: 138 mmol/L (ref 135–145)
TCO2: 27 mmol/L (ref 22–32)

## 2023-03-18 LAB — CBC
HCT: 42.1 % (ref 39.0–52.0)
Hemoglobin: 14.2 g/dL (ref 13.0–17.0)
MCH: 30.9 pg (ref 26.0–34.0)
MCHC: 33.7 g/dL (ref 30.0–36.0)
MCV: 91.7 fL (ref 80.0–100.0)
Platelets: 305 10*3/uL (ref 150–400)
RBC: 4.59 MIL/uL (ref 4.22–5.81)
RDW: 12.5 % (ref 11.5–15.5)
WBC: 10.8 10*3/uL — ABNORMAL HIGH (ref 4.0–10.5)
nRBC: 0 % (ref 0.0–0.2)

## 2023-03-18 LAB — I-STAT CG4 LACTIC ACID, ED: Lactic Acid, Venous: 0.8 mmol/L (ref 0.5–1.9)

## 2023-03-18 MED ORDER — MELOXICAM 15 MG PO TABS: 15.0000 mg | ORAL_TABLET | Freq: Every day | ORAL | 0 refills | Status: DC

## 2023-03-18 MED ORDER — IOHEXOL 350 MG/ML SOLN
75.0000 mL | Freq: Once | INTRAVENOUS | Status: AC | PRN
  Administered 2023-03-18: 75 mL via INTRAVENOUS

## 2023-03-18 MED ORDER — HYDROMORPHONE HCL 1 MG/ML IJ SOLN
1.0000 mg | Freq: Once | INTRAMUSCULAR | Status: AC
  Administered 2023-03-18: 1 mg via INTRAVENOUS
  Filled 2023-03-18: qty 1

## 2023-03-18 MED ORDER — MORPHINE SULFATE (PF) 4 MG/ML IV SOLN
4.0000 mg | Freq: Once | INTRAVENOUS | Status: AC
  Administered 2023-03-18: 4 mg via INTRAVENOUS
  Filled 2023-03-18: qty 1

## 2023-03-18 MED ORDER — METHOCARBAMOL 500 MG PO TABS: 1000.0000 mg | ORAL_TABLET | Freq: Three times a day (TID) | ORAL | 0 refills | Status: AC | PRN

## 2023-03-18 MED ORDER — FENTANYL CITRATE PF 50 MCG/ML IJ SOSY
100.0000 ug | PREFILLED_SYRINGE | Freq: Once | INTRAMUSCULAR | Status: AC
Start: 2023-03-18 — End: 2023-03-18
  Administered 2023-03-18: 100 ug via INTRAVENOUS
  Filled 2023-03-18: qty 2

## 2023-03-18 MED ORDER — MELOXICAM 15 MG PO TABS: 15.0000 mg | ORAL_TABLET | Freq: Every day | ORAL | 0 refills | Status: AC

## 2023-03-18 MED ORDER — OXYCODONE-ACETAMINOPHEN 5-325 MG PO TABS: 1.0000 | ORAL_TABLET | Freq: Four times a day (QID) | ORAL | 0 refills | Status: DC | PRN

## 2023-03-18 MED ORDER — KETOROLAC TROMETHAMINE 15 MG/ML IJ SOLN
15.0000 mg | Freq: Once | INTRAMUSCULAR | Status: AC
  Administered 2023-03-18: 15 mg via INTRAVENOUS
  Filled 2023-03-18: qty 1

## 2023-03-18 MED ORDER — LACTATED RINGERS IV BOLUS
1000.0000 mL | Freq: Once | INTRAVENOUS | Status: AC
Start: 2023-03-18 — End: 2023-03-18
  Administered 2023-03-18: 1000 mL via INTRAVENOUS

## 2023-03-18 MED ORDER — OXYCODONE-ACETAMINOPHEN 5-325 MG PO TABS: 1.0000 | ORAL_TABLET | Freq: Four times a day (QID) | ORAL | 0 refills | Status: AC | PRN

## 2023-03-18 NOTE — ED Notes (Signed)
Abrasions on right forearm cleaned with NS, covered with xeroform gauze and normal gauze, covered with coband.
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
## 2023-03-18 NOTE — Discharge Instructions (Addendum)
 You have a mild compression deformity of your T3 vertebra.  This is not something that she would need surgery.  This should heal with time.  You can follow-up with your orthopedic doctor regarding this as needed.  You have areas of bruising on left neck and around right kidney.  Prescriptions for pain medication were sent to your pharmacy: -Meloxicam  is an NSAID.  Take this daily in place of over-the-counter NSAIDs (ibuprofen, Aleve , etc.) -Methocarbamol  is a muscle relaxer.  Take as needed. -Oxycodone  is a narcotic pain medication.  Take only as needed.

## 2023-03-18 NOTE — Progress Notes (Signed)
Orthopedic Tech Progress Note Patient Details:  Hunter Henderson 1985-04-19 [id number]  Patient ID: Hunter Henderson, male   DOB: [birth year], 38 y.o.   MRN: [id number] I attended trauma page. Trinna Post 03/18/2023, 1:40 AM

## 2023-03-18 NOTE — ED Notes (Signed)
 Patient transported to CT
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
## 2023-03-24 ENCOUNTER — Encounter (HOSPITAL_COMMUNITY): Payer: Self-pay | Admitting: Emergency Medicine

## 2023-03-24 ENCOUNTER — Emergency Department (HOSPITAL_COMMUNITY)
Admission: EM | Admit: 2023-03-24 | Discharge: 2023-03-25 | Disposition: A | Discharge status: Home / Self Care | Payer: Worker's Compensation | Attending: Emergency Medicine | Admitting: Emergency Medicine

## 2023-03-24 ENCOUNTER — Emergency Department (HOSPITAL_COMMUNITY): Payer: Self-pay

## 2023-03-24 DIAGNOSIS — R109 Unspecified abdominal pain: Secondary | ICD-10-CM | POA: Diagnosis present

## 2023-03-24 DIAGNOSIS — K683 Retroperitoneal hematoma: Secondary | ICD-10-CM | POA: Diagnosis not present

## 2023-03-24 DIAGNOSIS — N50812 Left testicular pain: Secondary | ICD-10-CM | POA: Diagnosis not present

## 2023-03-24 DIAGNOSIS — K469 Unspecified abdominal hernia without obstruction or gangrene: Secondary | ICD-10-CM | POA: Insufficient documentation

## 2023-03-24 DIAGNOSIS — N50819 Testicular pain, unspecified: Secondary | ICD-10-CM

## 2023-03-24 LAB — URINALYSIS, ROUTINE W REFLEX MICROSCOPIC
Bilirubin Urine: NEGATIVE
Glucose, UA: NEGATIVE mg/dL
Hgb urine dipstick: NEGATIVE
Ketones, ur: NEGATIVE mg/dL
Leukocytes,Ua: NEGATIVE
Nitrite: NEGATIVE
Protein, ur: NEGATIVE mg/dL
Specific Gravity, Urine: 1.024 (ref 1.005–1.030)
pH: 5 (ref 5.0–8.0)

## 2023-03-24 LAB — COMPREHENSIVE METABOLIC PANEL
ALT: 26 U/L (ref 0–44)
AST: 19 U/L (ref 15–41)
Albumin: 3.5 g/dL (ref 3.5–5.0)
Alkaline Phosphatase: 49 U/L (ref 38–126)
Anion gap: 9 (ref 5–15)
BUN: 12 mg/dL (ref 6–20)
CO2: 28 mmol/L (ref 22–32)
Calcium: 9.4 mg/dL (ref 8.9–10.3)
Chloride: 104 mmol/L (ref 98–111)
Creatinine, Ser: 1 mg/dL (ref 0.61–1.24)
GFR, Estimated: >60 mL/min (ref >60)
Glucose, Bld: 94 mg/dL (ref 70–99)
Potassium: 4.3 mmol/L (ref 3.5–5.1)
Sodium: 141 mmol/L (ref 135–145)
Total Bilirubin: 0.6 mg/dL (ref 0.0–1.2)
Total Protein: 6.8 g/dL (ref 6.5–8.1)

## 2023-03-24 LAB — CBC WITH DIFFERENTIAL/PLATELET
Abs Immature Granulocytes: 0.03 10*3/uL (ref 0.00–0.07)
Basophils Absolute: 0.1 10*3/uL (ref 0.0–0.1)
Basophils Relative: 1 %
Eosinophils Absolute: 0.3 10*3/uL (ref 0.0–0.5)
Eosinophils Relative: 5 %
HCT: 43.1 % (ref 39.0–52.0)
Hemoglobin: 14.4 g/dL (ref 13.0–17.0)
Immature Granulocytes: 1 %
Lymphocytes Relative: 29 %
Lymphs Abs: 1.9 10*3/uL (ref 0.7–4.0)
MCH: 30.5 pg (ref 26.0–34.0)
MCHC: 33.4 g/dL (ref 30.0–36.0)
MCV: 91.3 fL (ref 80.0–100.0)
Monocytes Absolute: 0.6 10*3/uL (ref 0.1–1.0)
Monocytes Relative: 9 %
Neutro Abs: 3.6 10*3/uL (ref 1.7–7.7)
Neutrophils Relative %: 55 %
Platelets: 343 10*3/uL (ref 150–400)
RBC: 4.72 MIL/uL (ref 4.22–5.81)
RDW: 12.2 % (ref 11.5–15.5)
WBC: 6.4 10*3/uL (ref 4.0–10.5)
nRBC: 0 % (ref 0.0–0.2)

## 2023-03-24 MED ORDER — IOHEXOL 350 MG/ML SOLN
75.0000 mL | Freq: Once | INTRAVENOUS | Status: AC | PRN
  Administered 2023-03-24: 75 mL via INTRAVENOUS
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
